# Patient Record
Sex: Male | Born: 1965
Health system: Southern US, Community
[De-identification: ages and names within clinical notes are randomized; demographics above are authoritative.]

## PROBLEM LIST (undated history)

## (undated) DIAGNOSIS — G8929 Other chronic pain: Secondary | ICD-10-CM

## (undated) DIAGNOSIS — N529 Male erectile dysfunction, unspecified: Secondary | ICD-10-CM

## (undated) DIAGNOSIS — I10 Essential (primary) hypertension: Secondary | ICD-10-CM

## (undated) HISTORY — PX: EYE SURGERY: SHX253

## (undated) HISTORY — DX: Other chronic pain: G89.29

## (undated) HISTORY — DX: Male erectile dysfunction, unspecified: N52.9

## (undated) HISTORY — DX: Essential (primary) hypertension: I10

---

## 2018-04-01 ENCOUNTER — Emergency Department (HOSPITAL_COMMUNITY): Payer: Self-pay

## 2018-04-01 ENCOUNTER — Emergency Department (HOSPITAL_COMMUNITY)
Admission: EM | Admit: 2018-04-01 | Discharge: 2018-04-01 | Disposition: A | Discharge status: Home / Self Care | Payer: Self-pay | Attending: Emergency Medicine | Admitting: Emergency Medicine

## 2018-04-01 ENCOUNTER — Encounter (HOSPITAL_COMMUNITY): Payer: Self-pay

## 2018-04-01 DIAGNOSIS — Y939 Activity, unspecified: Secondary | ICD-10-CM | POA: Insufficient documentation

## 2018-04-01 DIAGNOSIS — Y929 Unspecified place or not applicable: Secondary | ICD-10-CM | POA: Insufficient documentation

## 2018-04-01 DIAGNOSIS — R1031 Right lower quadrant pain: Secondary | ICD-10-CM | POA: Insufficient documentation

## 2018-04-01 DIAGNOSIS — X58XXXA Exposure to other specified factors, initial encounter: Secondary | ICD-10-CM | POA: Insufficient documentation

## 2018-04-01 DIAGNOSIS — Z79899 Other long term (current) drug therapy: Secondary | ICD-10-CM | POA: Insufficient documentation

## 2018-04-01 DIAGNOSIS — S39012A Strain of muscle, fascia and tendon of lower back, initial encounter: Secondary | ICD-10-CM | POA: Insufficient documentation

## 2018-04-01 DIAGNOSIS — Y999 Unspecified external cause status: Secondary | ICD-10-CM | POA: Insufficient documentation

## 2018-04-01 LAB — URINALYSIS, ROUTINE W REFLEX MICROSCOPIC
Bacteria, UA: NONE SEEN
Bilirubin Urine: NEGATIVE
GLUCOSE, UA: NEGATIVE mg/dL
Hgb urine dipstick: NEGATIVE
Ketones, ur: NEGATIVE mg/dL
NITRITE: NEGATIVE
PH: 6 (ref 5.0–8.0)
Protein, ur: NEGATIVE mg/dL
SPECIFIC GRAVITY, URINE: 1.006 (ref 1.005–1.030)

## 2018-04-01 LAB — CBC WITH DIFFERENTIAL/PLATELET
ABS IMMATURE GRANULOCYTES: 0.01 10*3/uL (ref 0.00–0.07)
Basophils Absolute: 0 10*3/uL (ref 0.0–0.1)
Basophils Relative: 0 %
Eosinophils Absolute: 0.2 10*3/uL (ref 0.0–0.5)
Eosinophils Relative: 3 %
HCT: 46 % (ref 39.0–52.0)
Hemoglobin: 15.3 g/dL (ref 13.0–17.0)
IMMATURE GRANULOCYTES: 0 %
LYMPHS ABS: 2.1 10*3/uL (ref 0.7–4.0)
Lymphocytes Relative: 30 %
MCH: 30.5 pg (ref 26.0–34.0)
MCHC: 33.3 g/dL (ref 30.0–36.0)
MCV: 91.6 fL (ref 80.0–100.0)
MONOS PCT: 9 %
Monocytes Absolute: 0.7 10*3/uL (ref 0.1–1.0)
NEUTROS ABS: 4.1 10*3/uL (ref 1.7–7.7)
NEUTROS PCT: 58 %
PLATELETS: 248 10*3/uL (ref 150–400)
RBC: 5.02 MIL/uL (ref 4.22–5.81)
RDW: 13.5 % (ref 11.5–15.5)
WBC: 7.1 10*3/uL (ref 4.0–10.5)
nRBC: 0 % (ref 0.0–0.2)

## 2018-04-01 LAB — COMPREHENSIVE METABOLIC PANEL
ALT: 28 U/L (ref 0–44)
ANION GAP: 9 (ref 5–15)
AST: 25 U/L (ref 15–41)
Albumin: 4.2 g/dL (ref 3.5–5.0)
Alkaline Phosphatase: 73 U/L (ref 38–126)
BILIRUBIN TOTAL: 1 mg/dL (ref 0.3–1.2)
BUN: 12 mg/dL (ref 6–20)
CO2: 24 mmol/L (ref 22–32)
Calcium: 9.4 mg/dL (ref 8.9–10.3)
Chloride: 107 mmol/L (ref 98–111)
Creatinine, Ser: 1.05 mg/dL (ref 0.61–1.24)
GFR calc non Af Amer: >60 mL/min (ref >60)
Glucose, Bld: 110 mg/dL — ABNORMAL HIGH (ref 70–99)
POTASSIUM: 3.5 mmol/L (ref 3.5–5.1)
Sodium: 140 mmol/L (ref 135–145)
TOTAL PROTEIN: 6.8 g/dL (ref 6.5–8.1)

## 2018-04-01 LAB — LIPASE, BLOOD: LIPASE: 32 U/L (ref 11–51)

## 2018-04-01 MED ORDER — SODIUM CHLORIDE 0.9 % IV BOLUS
1000.0000 mL | Freq: Once | INTRAVENOUS | Status: AC
  Administered 2018-04-01: 1000 mL via INTRAVENOUS

## 2018-04-01 MED ORDER — MORPHINE SULFATE (PF) 4 MG/ML IV SOLN
4.0000 mg | Freq: Once | INTRAVENOUS | Status: AC
  Administered 2018-04-01: 4 mg via INTRAVENOUS
  Filled 2018-04-01: qty 1

## 2018-04-01 MED ORDER — HYDROCODONE-ACETAMINOPHEN 5-325 MG PO TABS: 1.0000 | ORAL_TABLET | ORAL | 0 refills | Status: AC | PRN

## 2018-04-01 MED ORDER — ONDANSETRON HCL 4 MG/2ML IJ SOLN
4.0000 mg | Freq: Once | INTRAMUSCULAR | Status: AC
  Administered 2018-04-01: 4 mg via INTRAVENOUS
  Filled 2018-04-01: qty 2

## 2018-04-01 NOTE — ED Notes (Signed)
Pt discharged from ED; instructions provided and scripts given; Pt encouraged to return to ED if symptoms worsen and to f/u with PCP; Pt verbalized understanding of all instructions 

## 2018-04-01 NOTE — ED Provider Notes (Signed)
[facility] EMERGENCY DEPARTMENT Provider Note   CSN: [id number] Arrival date & time: 04/01/18  0645     History   Chief Complaint Chief Complaint  Patient presents with  . Back Pain    HPI Steve Schneider is a 52 y.o. male.  Pt presents to the ED today with right sided back pain radiating to his right flank.  The pt said it started at 2100.  The pt denies n/v or f/c.     Pmhx:  htn      Home Medications    Prior to Admission medications   Medication Sig Start Date End Date Taking? Authorizing Provider  amLODipine (NORVASC) 10 MG tablet Take 10 mg by mouth daily.   Yes [provider]  cloNIDine (CATAPRES) 0.1 MG tablet Take 0.1 mg by mouth 2 (two) times daily.   Yes [provider]  LOSARTAN POTASSIUM PO Take by mouth daily.    Yes [provider]  HYDROcodone-acetaminophen (NORCO/VICODIN) 5-325 MG tablet Take 1 tablet by mouth every 4 (four) hours as needed. 04/01/18   Jacalyn Lefevre, MD    Family History History reviewed. No pertinent family history.  Social History Social History   Tobacco Use  . Smoking status: Not on file  Substance Use Topics  . Alcohol use: Not on file  . Drug use: Not on file   + tob + etoh  Allergies   Patient has no known allergies.   Review of Systems Review of Systems  Gastrointestinal: Positive for abdominal pain.  Musculoskeletal: Positive for back pain.  All other systems reviewed and are negative.    Physical Exam Updated Vital Signs BP (!) 152/109 (BP Location: Right Arm)   Pulse 76   Temp 97.9 F (36.6 C) (Oral)   SpO2 97%   Physical Exam  Constitutional: He is oriented to person, place, and time. He appears well-developed and well-nourished.  HENT:  Head: Normocephalic and atraumatic.  Right Ear: External ear normal.  Left Ear: External ear normal.  Nose: Nose normal.  Mouth/Throat: Oropharynx is clear and moist.  Eyes: Pupils are equal, round,  and reactive to light. Conjunctivae and EOM are normal.  Neck: Normal range of motion. Neck supple.  Cardiovascular: Normal rate, regular rhythm, normal heart sounds and intact distal pulses.  Pulmonary/Chest: Effort normal and breath sounds normal.  Abdominal: Soft. Bowel sounds are normal.  Musculoskeletal: Normal range of motion.  Neurological: He is alert and oriented to person, place, and time.  Skin: Skin is warm and dry. Capillary refill takes less than 2 seconds.  Psychiatric: He has a normal mood and affect. His behavior is normal. Judgment and thought content normal.  Nursing note and vitals reviewed.    ED Treatments / Results  Labs (all labs ordered are listed, but only abnormal results are displayed) Labs Reviewed  COMPREHENSIVE METABOLIC PANEL - Abnormal; Notable for the following components:      Result Value   Glucose, Bld 110 (*)    All other components within normal limits  URINALYSIS, ROUTINE W REFLEX MICROSCOPIC - Abnormal; Notable for the following components:   Color, Urine STRAW (*)    Leukocytes, UA SMALL (*)    All other components within normal limits  CBC WITH DIFFERENTIAL/PLATELET  LIPASE, BLOOD    EKG None  Radiology Ct Renal Stone Study  Result Date: 04/01/2018 CLINICAL DATA:  Right flank pain EXAM: CT ABDOMEN AND PELVIS WITHOUT CONTRAST TECHNIQUE: Multidetector CT imaging of the abdomen and pelvis  was performed following the standard protocol without IV contrast. COMPARISON:  12/08/2008 FINDINGS: Lower chest: Lung bases are clear. Hepatobiliary: Unenhanced liver is unremarkable. Gallbladder is unremarkable. No intrahepatic or extrahepatic ductal dilatation. Pancreas: Within normal limits. Spleen: Within normal limits. Adrenals/Urinary Tract: Adrenal glands are within normal limits. Kidneys are within normal limits. No renal, ureteral, or bladder calculi. No hydronephrosis. Bladder is mildly thick-walled although underdistended. Stomach/Bowel: Stomach  is within normal limits. No evidence of bowel obstruction. Normal appendix (series 3/image 50). Vascular/Lymphatic: No evidence of abdominal aortic aneurysm. No suspicious abdominopelvic lymphadenopathy. Reproductive: Prostate is unremarkable. Other: No abdominopelvic ascites. Musculoskeletal: Mild degenerative changes of the lower lumbar spine. IMPRESSION: No renal, ureteral, or bladder calculi. No evidence of bowel obstruction. Normal appendix. No CT findings to account for the patient's right abdominal pain. Electronically Signed   By: Charline BillsSriyesh  Krishnan M.D.   On: 04/01/2018 07:40    Procedures Procedures (including critical care time)  Medications Ordered in ED Medications  morphine 4 MG/ML injection 4 mg (4 mg Intravenous Given 04/01/18 0715)  ondansetron (ZOFRAN) injection 4 mg (4 mg Intravenous Given 04/01/18 0715)  sodium chloride 0.9 % bolus 1,000 mL (0 mLs Intravenous Stopped 04/01/18 0839)     Initial Impression / Assessment and Plan / ED Course  I have reviewed the triage vital signs and the nursing notes.  Pertinent labs & imaging results that were available during my care of the patient were reviewed by me and considered in my medical decision making (see chart for details).     Pt is feeling much better. No etiology of abdominal pain identified.  Pt is stable for d/c.  F/u with pcp.  Final Clinical Impressions(s) / ED Diagnoses   Final diagnoses:  Strain of lumbar region, initial encounter    ED Discharge Orders         Ordered    HYDROcodone-acetaminophen (NORCO/VICODIN) 5-325 MG tablet  Every 4 hours PRN     04/01/18 0959           Jacalyn LefevreHaviland, Eldena Dede, MD 04/01/18 1000

## 2018-04-01 NOTE — ED Triage Notes (Signed)
Pt c/o back pain on R side with radiation to R abd; pt states that pain started last night at 9p.

## 2019-12-24 ENCOUNTER — Ambulatory Visit: Payer: Self-pay | Attending: Internal Medicine

## 2019-12-24 DIAGNOSIS — Z23 Encounter for immunization: Secondary | ICD-10-CM

## 2019-12-24 NOTE — Progress Notes (Signed)
   Covid-19 Vaccination Clinic  Name:  Steve Schneider    MRN: [id number] DOB: July 11, 1965  12/24/2019  [name] was observed post Covid-19 immunization for 15 minutes without incident. He was provided with Vaccine Information Sheet and instruction to access the V-Safe system.   [name] was instructed to call 911 with any severe reactions post vaccine: Marland Kitchen Difficulty breathing  . Swelling of face and throat  . A fast heartbeat  . A bad rash all over body  . Dizziness and weakness   Immunizations Administered    Name Date Dose VIS Date Route   Pfizer COVID-19 Vaccine 12/24/2019  4:24 PM 0.3 mL 07/10/2018 Intramuscular   Manufacturer: [employer], [employer]   Lot: [id number]   NDC: [address]-0

## 2020-01-14 ENCOUNTER — Ambulatory Visit: Payer: Self-pay

## 2020-04-12 IMAGING — CT CT RENAL STONE PROTOCOL
2 of 4 series · 16 of 46 positions shown, 18 images · non-contrast
Comparison: 12/08/2008

CLINICAL DATA: Right flank pain

EXAM:
CT ABDOMEN AND PELVIS WITHOUT CONTRAST
TECHNIQUE: Multidetector CT imaging of the abdomen and pelvis was performed
following the standard protocol without IV contrast.

[Series 3: renal stone 5.0 · axial · 0.73mm/px · z∈[+909,+1289]mm · 13 of 87 slices shown, 15 images]
[im 7/87  soft-tissue]
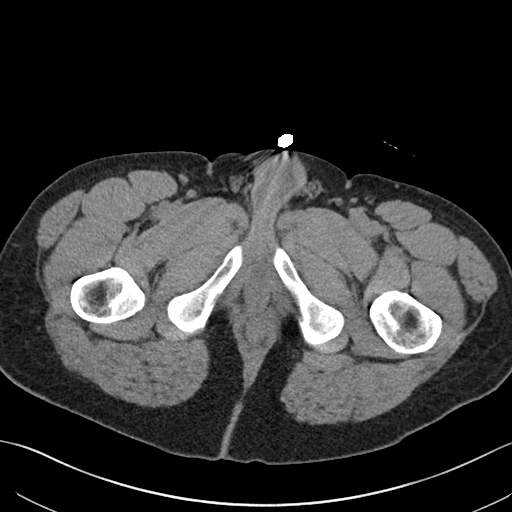
[im 7/87  bone]
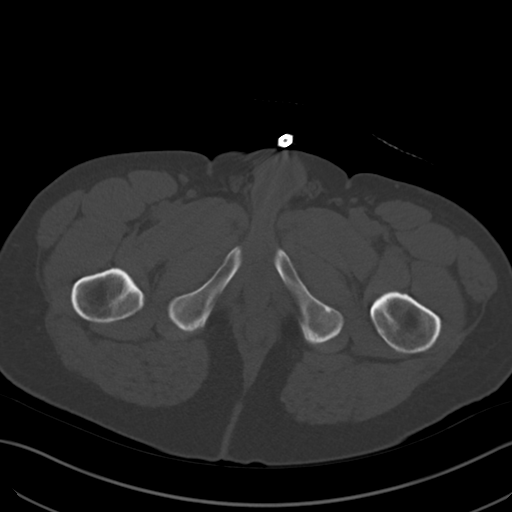
[im 13/87  soft-tissue]
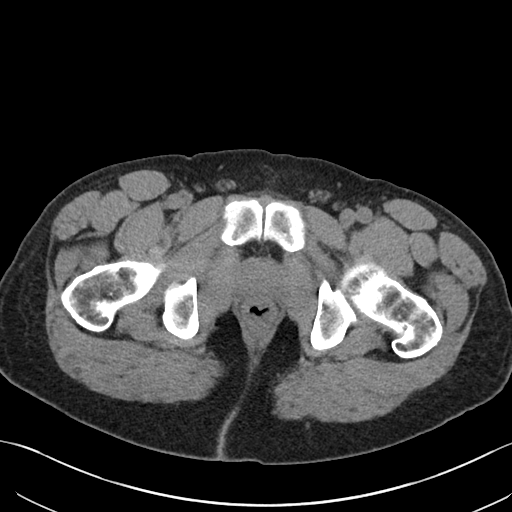
[im 20/87  soft-tissue]
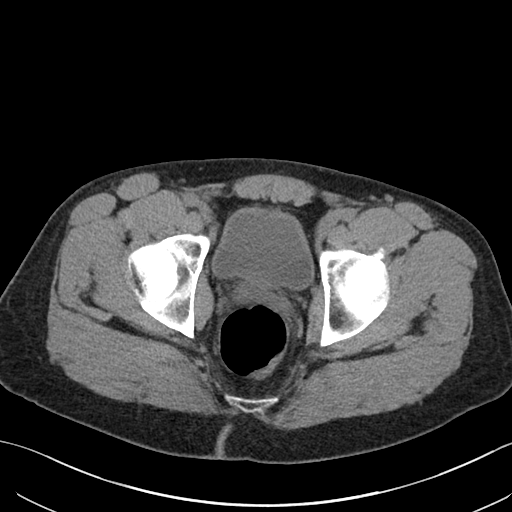
[im 26/87  soft-tissue]
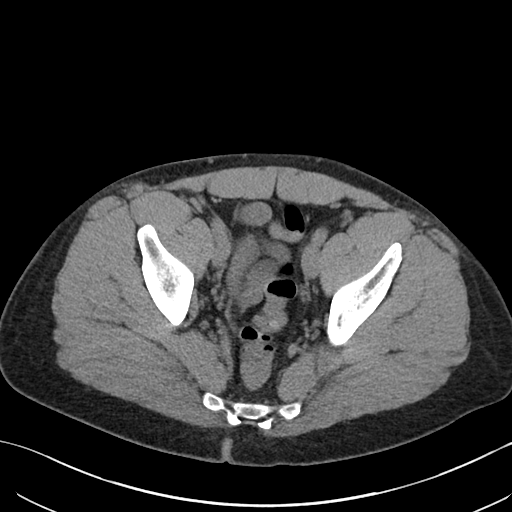
[im 32/87  soft-tissue]
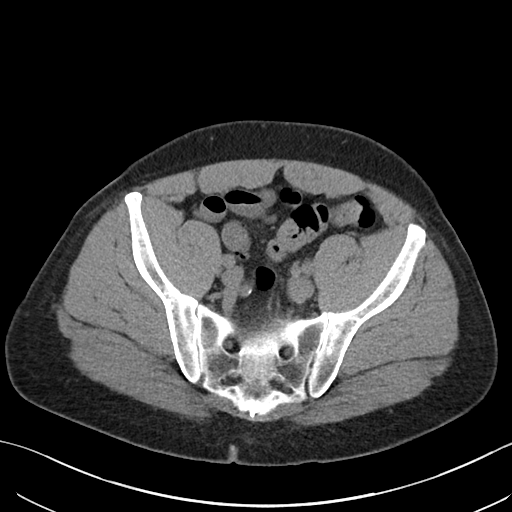
[im 39/87  soft-tissue]
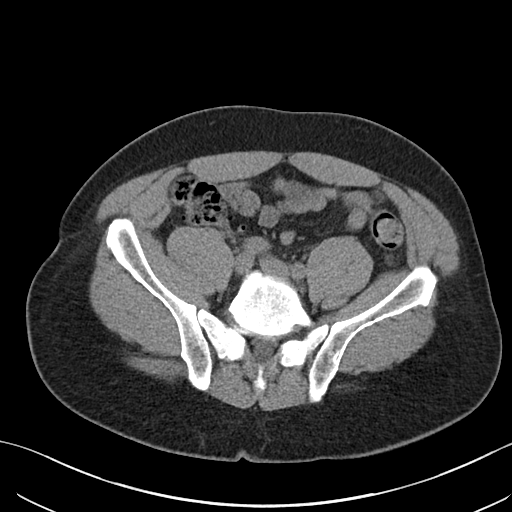
[im 45/87  soft-tissue]
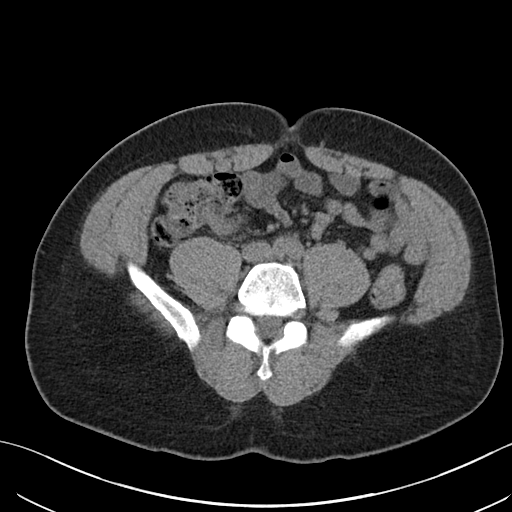
[im 51/87  soft-tissue]
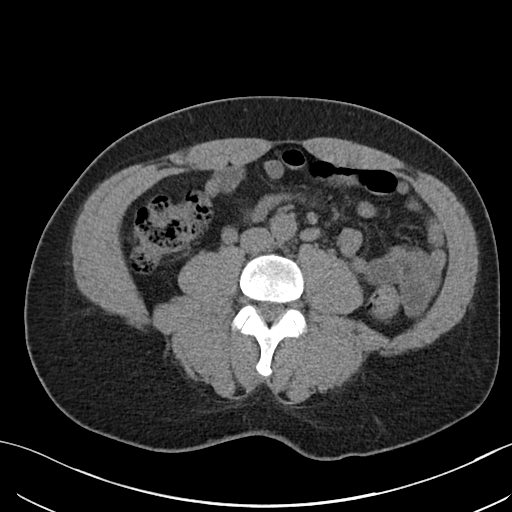
[im 58/87  soft-tissue]
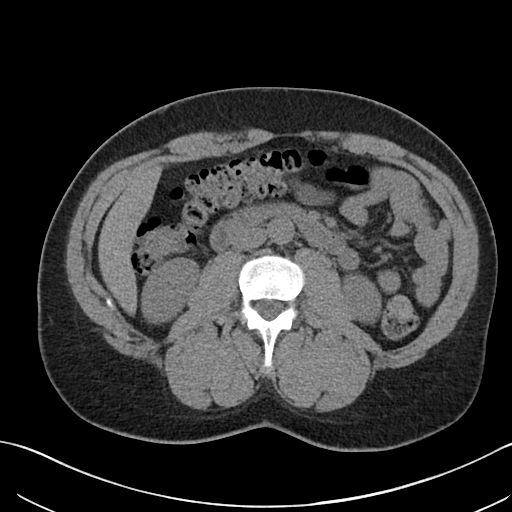
[im 58/87  bone]
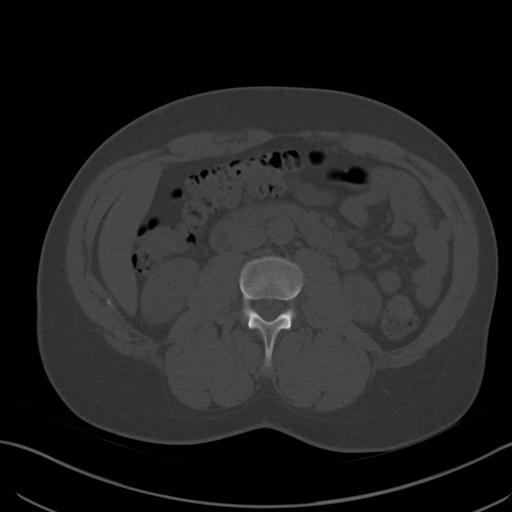
[im 64/87  soft-tissue]
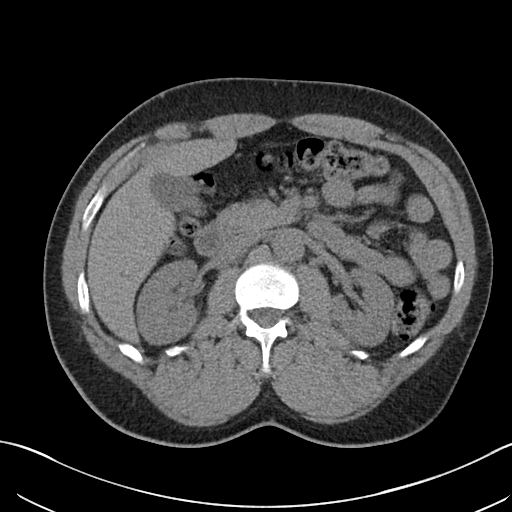
[im 71/87  soft-tissue]
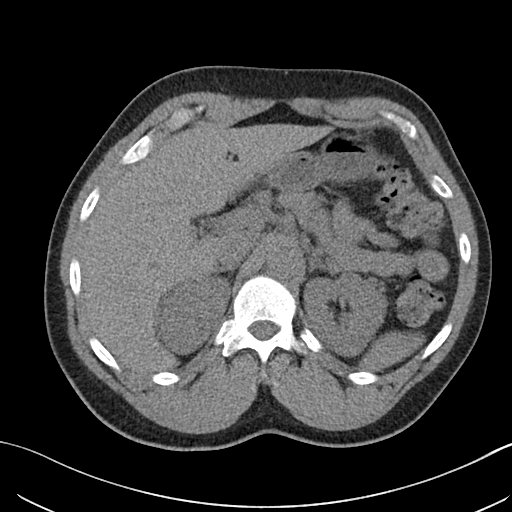
[im 77/87  soft-tissue]
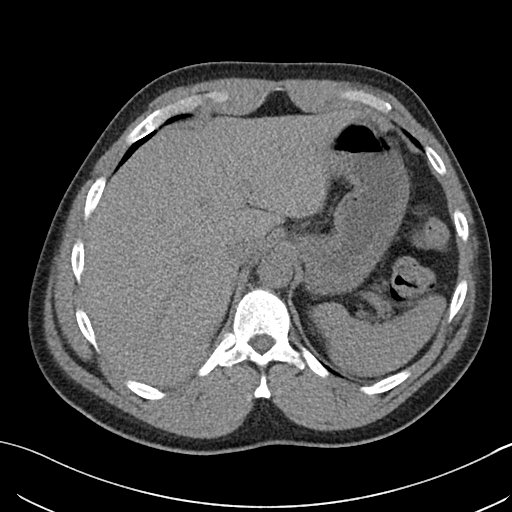
[im 83/87  soft-tissue]
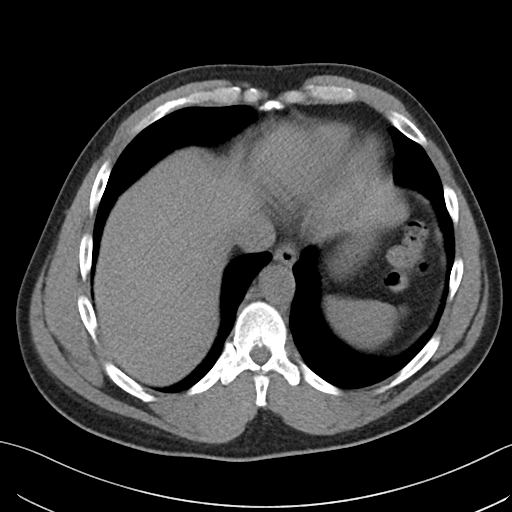

[Series 5: renal stone 3.0 cor · coronal · 0.68mm/px · 3 of 101 slices shown]
[im 34/101  soft-tissue]
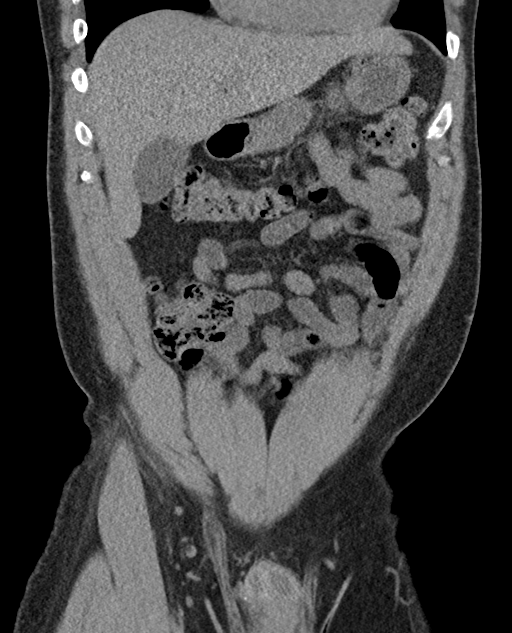
[im 45/101  soft-tissue]
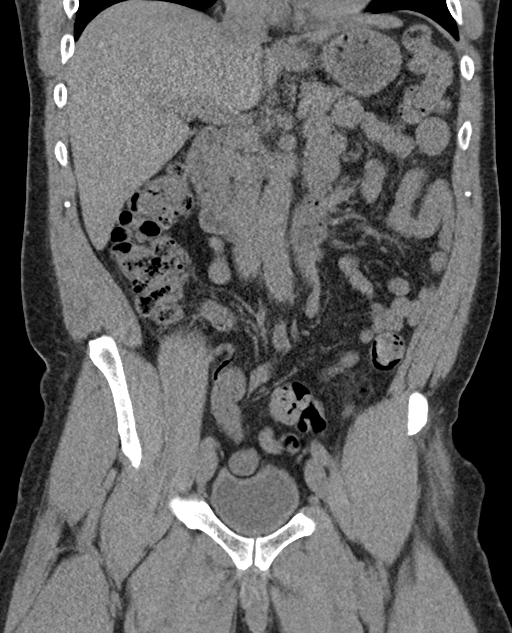
[im 56/101  soft-tissue]
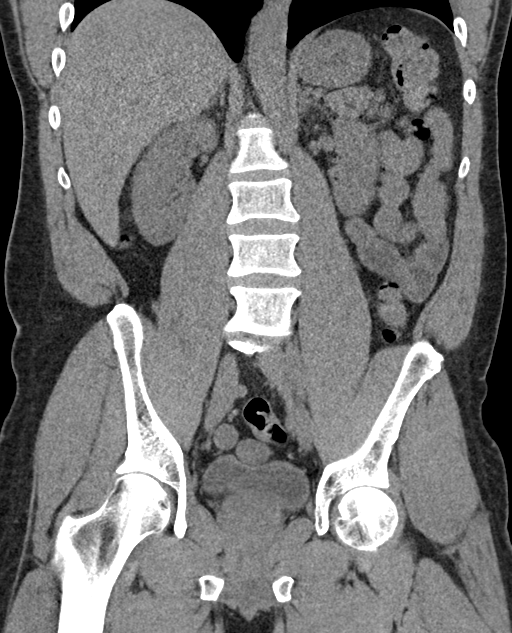

[16 of 46 positions shown; findings below may reference images not displayed]

FINDINGS: Lower chest: Lung bases are clear.

Hepatobiliary: Unenhanced liver is unremarkable.

Gallbladder is unremarkable. No intrahepatic or extrahepatic ductal
dilatation.

Pancreas: Within normal limits.

Spleen: Within normal limits.

Adrenals/Urinary Tract: Adrenal glands are within normal limits.

Kidneys are within normal limits. No renal, ureteral, or bladder
calculi. No hydronephrosis.

Bladder is mildly thick-walled although underdistended.

Stomach/Bowel: Stomach is within normal limits.

No evidence of bowel obstruction.

Normal appendix (series 3/image 50).

Vascular/Lymphatic: No evidence of abdominal aortic aneurysm.

No suspicious abdominopelvic lymphadenopathy.

Reproductive: Prostate is unremarkable.

Other: No abdominopelvic ascites.

Musculoskeletal: Mild degenerative changes of the lower lumbar
spine.
IMPRESSION: No renal, ureteral, or bladder calculi.

No evidence of bowel obstruction. Normal appendix.

No CT findings to account for the patient's right abdominal pain.

## 2021-06-03 ENCOUNTER — Ambulatory Visit: Payer: 59 | Admitting: Family

## 2021-06-03 NOTE — Progress Notes (Signed)
New Patient Office Visit  Subjective:  Patient ID: Steve Schneider, male    DOB: 06-06-1965  Age: 56 y.o. MRN: [id number]  CC:  Chief Complaint  Patient presents with   Establish Care    NP. Est care. Pt c/o chronic back pain w/ osteoarthritis. Request med review & refills    HPI Steve Schneider presents for new patient visit to establish care.  Introduced to Designer, jewellery role and practice setting.  All questions answered.  Discussed provider/patient relationship and expectations.  BACK PAIN  Duration: years Mechanism of injury: unknown - did a lot of lifting for work Location: midline and low back Onset: gradual Severity: 9/10 Quality: aching, sharp Frequency: constant Radiation: none Aggravating factors: laying and prolonged sitting Alleviating factors: ice, heat, and narcotics Status: stable Treatments attempted: rest, ice, heat, APAP, ibuprofen, and physical therapy  Relief with NSAIDs?: no Nighttime pain:  yes Paresthesias / decreased sensation:  no Bowel / bladder incontinence:  no Fevers:  no Dysuria / urinary frequency:  no  HYPERTENSION  Hypertension status: uncontrolled - out of medications for last 3-4 weeks Satisfied with current treatment? yes Duration of hypertension: chronic BP monitoring frequency:  daily BP range: 190/130 BP medication side effects:  no Medication compliance: fair compliance Previous BP meds: amlodipine, labetalol  Aspirin: no Recurrent headaches: no Visual changes: yes - chronic Palpitations: no Dyspnea: no Chest pain: no Lower extremity edema: no Dizzy/lightheaded: no   Past Medical History:  Diagnosis Date   Chronic back pain    ED (erectile dysfunction)    Hypertension     Past Surgical History:  Procedure Laterality Date   EYE SURGERY Right    when a kid    Family History  Problem Relation Age of Onset   Diabetes Mother     Social History   Socioeconomic History   Marital status:  Single    Spouse name: Not on file   Number of children: Not on file   Years of education: Not on file   Highest education level: Not on file  Occupational History   Not on file  Tobacco Use   Smoking status: Every Day    Packs/day: 0.50    Types: Cigarettes   Smokeless tobacco: Never  Vaping Use   Vaping Use: Some days  Substance and Sexual Activity   Alcohol use: Not Currently   Drug use: Never   Sexual activity: Yes  Other Topics Concern   Not on file  Social History Narrative   Not on file   Social Determinants of Health   Financial Resource Strain: Not on file  Food Insecurity: Not on file  Transportation Needs: Not on file  Physical Activity: Not on file  Stress: Not on file  Social Connections: Not on file  Intimate Partner Violence: Not on file    ROS Review of Systems  Constitutional: Negative.   HENT: Negative.    Eyes:  Positive for visual disturbance (chronic, follows with optometry).  Respiratory: Negative.    Cardiovascular: Negative.   Gastrointestinal: Negative.   Endocrine: Negative.   Genitourinary: Negative.   Musculoskeletal:  Positive for back pain.  Skin: Negative.   Neurological: Negative.   Psychiatric/Behavioral: Negative.     Objective:   Today's Vitals: BP (!) 170/120 (BP Location: Right Arm, Cuff Size: Large)    Pulse 85    Temp (!) 96.3 F (35.7 C) (Temporal)    Ht 5\' 9"  (1.753 m)    Wt 212 lb 6.4 oz (  96.3 kg)    SpO2 97%    BMI 31.37 kg/m   Physical Exam Vitals and nursing note reviewed.  Constitutional:      Appearance: Normal appearance.  HENT:     Head: Normocephalic.  Eyes:     Conjunctiva/sclera: Conjunctivae normal.  Cardiovascular:     Rate and Rhythm: Normal rate and regular rhythm.     Pulses: Normal pulses.     Heart sounds: Normal heart sounds.  Pulmonary:     Effort: Pulmonary effort is normal.     Breath sounds: Normal breath sounds.  Abdominal:     Palpations: Abdomen is soft.     Tenderness: There is  no abdominal tenderness.  Musculoskeletal:        General: Tenderness (mid-lower back) present. Normal range of motion.     Cervical back: Normal range of motion.     Right lower leg: No edema.     Left lower leg: No edema.  Skin:    General: Skin is warm.  Neurological:     General: No focal deficit present.     Mental Status: He is alert and oriented to person, place, and time.  Psychiatric:        Mood and Affect: Mood normal.        Behavior: Behavior normal.        Thought Content: Thought content normal.        Judgment: Judgment normal.    Assessment & Plan:   Problem List Items Addressed This Visit       Cardiovascular and Mediastinum   Primary hypertension    Chronic, not controlled. He has been out of his medications for 3-4 weeks. When he was taking his medications, his blood pressure ran 130s-140s/80s. Will re-start amlodipine 10mg  daily and labetalol 100mg  BID. Encourage him to continue checking his blood pressure daily and to write it down. Limit the amount of salt that he is eating in his diet. Last CMP, CBC, and TSH reviewed from 01/2021. Follow up in 4 weeks, will check labs next visit.       Relevant Medications   amLODipine (NORVASC) 10 MG tablet   labetalol (NORMODYNE) 100 MG tablet   sildenafil (VIAGRA) 100 MG tablet     Other   Tobacco use    Currently smokes 1/2 ppd. He had quit in the past cold [place], but after his mother passed away, he started smoking again. He has tried using nicotine patches, however they did not help. Encouraged complete tobacco cessation. He is not interested at this time.       Chronic midline low back pain without sciatica    Chronic, ongoing. He has had a MRI on 3/28/2 which shows disc bulge abutting the L5 nerve route. He has tried ibuprofen, tylenol, gabapentin, lyrica, and cymbalta in the past with no relief. He was taking oxycodone 10mg  daily which helped control his pain. He was referred to Dr. Primus Bravo with pain management  who recommended injections, however he did not want to do this. He was given hydrocodone 10mg /acetaminophen which does not help his pain as much. He would like a referral to a different pain clinic. Referral placed today.       Relevant Orders   Ambulatory referral to Pain Clinic   Obesity (BMI 30-39.9)    BMI 31 today. Discussed healthy eating along with minimizing sodium intake. Goal is to lose 1-2 pounds per day.       Healthcare maintenance  Health maintenance reviewed. Cologuard ordered today to screen for colon cancer. Information given on shingrix. He states he had a tetanus booster about 3 years ago and HIV/hepatitis screening 2 years ago, which was negative.       Erectile dysfunction    Chronic, ongoing. He has been taking viagra 100 mg as needed which has helped his symptoms. Refill sent to the pharmacy today.       Other Visit Diagnoses     Encounter to establish care    -  Primary   Screening for colon cancer       Cologuard ordered today   Relevant Orders   Cologuard       Outpatient Encounter Medications as of 06/04/2021  Medication Sig   [DISCONTINUED] labetalol (NORMODYNE) 100 MG tablet Take by mouth.   [DISCONTINUED] sildenafil (VIAGRA) 100 MG tablet Take by mouth.   amLODipine (NORVASC) 10 MG tablet Take 1 tablet (10 mg total) by mouth daily.   HYDROcodone-acetaminophen (NORCO/VICODIN) 5-325 MG tablet Take 1 tablet by mouth every 4 (four) hours as needed.   labetalol (NORMODYNE) 100 MG tablet Take 1 tablet (100 mg total) by mouth 2 (two) times daily.   sildenafil (VIAGRA) 100 MG tablet Take 1 tablet (100 mg total) by mouth as needed for erectile dysfunction.   [DISCONTINUED] amLODipine (NORVASC) 10 MG tablet Take 10 mg by mouth daily.   [DISCONTINUED] cloNIDine (CATAPRES) 0.1 MG tablet Take 0.1 mg by mouth 2 (two) times daily.   [DISCONTINUED] LOSARTAN POTASSIUM PO Take by mouth daily.    No facility-administered encounter medications on file as of  06/04/2021.    Follow-up: Return in about 4 weeks (around 07/02/2021) for Blood pressure.   Charyl Dancer, NP

## 2021-06-04 ENCOUNTER — Ambulatory Visit (INDEPENDENT_AMBULATORY_CARE_PROVIDER_SITE_OTHER): Payer: 59 | Admitting: Nurse Practitioner

## 2021-06-04 ENCOUNTER — Encounter: Payer: Self-pay | Admitting: Nurse Practitioner

## 2021-06-04 ENCOUNTER — Other Ambulatory Visit: Payer: Self-pay

## 2021-06-04 VITALS — BP 170/120 | HR 85 | Temp 96.3°F | Ht 69.0 in | Wt 212.4 lb

## 2021-06-04 DIAGNOSIS — Z720 Tobacco use: Secondary | ICD-10-CM | POA: Diagnosis not present

## 2021-06-04 DIAGNOSIS — I10 Essential (primary) hypertension: Secondary | ICD-10-CM | POA: Diagnosis not present

## 2021-06-04 DIAGNOSIS — E669 Obesity, unspecified: Secondary | ICD-10-CM

## 2021-06-04 DIAGNOSIS — Z1211 Encounter for screening for malignant neoplasm of colon: Secondary | ICD-10-CM

## 2021-06-04 DIAGNOSIS — Z7689 Persons encountering health services in other specified circumstances: Secondary | ICD-10-CM

## 2021-06-04 DIAGNOSIS — M545 Low back pain, unspecified: Secondary | ICD-10-CM | POA: Diagnosis not present

## 2021-06-04 DIAGNOSIS — G8929 Other chronic pain: Secondary | ICD-10-CM

## 2021-06-04 DIAGNOSIS — N529 Male erectile dysfunction, unspecified: Secondary | ICD-10-CM | POA: Diagnosis not present

## 2021-06-04 DIAGNOSIS — Z0000 Encounter for general adult medical examination without abnormal findings: Secondary | ICD-10-CM

## 2021-06-04 MED ORDER — SILDENAFIL CITRATE 100 MG PO TABS: 100.0000 mg | ORAL_TABLET | ORAL | 2 refills | Status: DC | PRN

## 2021-06-04 MED ORDER — LABETALOL HCL 100 MG PO TABS: 100.0000 mg | ORAL_TABLET | Freq: Two times a day (BID) | ORAL | 1 refills | Status: AC

## 2021-06-04 MED ORDER — AMLODIPINE BESYLATE 10 MG PO TABS: 10.0000 mg | ORAL_TABLET | Freq: Every day | ORAL | 1 refills | Status: AC

## 2021-06-04 NOTE — Assessment & Plan Note (Signed)
Chronic, not controlled. He has been out of his medications for 3-4 weeks. When he was taking his medications, his blood pressure ran 130s-140s/80s. Will re-start amlodipine 10mg  daily and labetalol 100mg  BID. Encourage him to continue checking his blood pressure daily and to write it down. Limit the amount of salt that he is eating in his diet. Last CMP, CBC, and TSH reviewed from 01/2021. Follow up in 4 weeks, will check labs next visit.

## 2021-06-04 NOTE — Assessment & Plan Note (Signed)
Currently smokes 1/2 ppd. He had quit in the past cold [place], but after his mother passed away, he started smoking again. He has tried using nicotine patches, however they did not help. Encouraged complete tobacco cessation. He is not interested at this time.

## 2021-06-04 NOTE — Assessment & Plan Note (Signed)
BMI 31 today. Discussed healthy eating along with minimizing sodium intake. Goal is to lose 1-2 pounds per day.

## 2021-06-04 NOTE — Assessment & Plan Note (Signed)
Chronic, ongoing. He has been taking viagra 100 mg as needed which has helped his symptoms. Refill sent to the pharmacy today.

## 2021-06-04 NOTE — Assessment & Plan Note (Signed)
Health maintenance reviewed. Cologuard ordered today to screen for colon cancer. Information given on shingrix. He states he had a tetanus booster about 3 years ago and HIV/hepatitis screening 2 years ago, which was negative.

## 2021-06-04 NOTE — Assessment & Plan Note (Signed)
Chronic, ongoing. He has had a MRI on 3/28/2 which shows disc bulge abutting the L5 nerve route. He has tried ibuprofen, tylenol, gabapentin, lyrica, and cymbalta in the past with no relief. He was taking oxycodone 10mg  daily which helped control his pain. He was referred to Dr. with pain management who recommended injections, however he did not want to do this. He was given hydrocodone 10mg /acetaminophen which does not help his pain as much. He would like a referral to a different pain clinic. Referral placed today.

## 2021-06-04 NOTE — Patient Instructions (Addendum)
It was great to see you!  I have refilled your blood pressure medications, start taking them as you were before. Continue to check your blood pressure daily. Limit the amount of sodium (salt) you eat in your diet  Let's follow-up in 4 weeks, sooner if you have concerns.  If a referral was placed today, you will be contacted for an appointment. Please note that routine referrals can sometimes take up to 3-4 weeks to process. Please call our office if you haven't heard anything after this time frame.  Take care,  Rodman Pickle, NP

## 2021-06-09 ENCOUNTER — Telehealth: Payer: Self-pay | Admitting: Nurse Practitioner

## 2021-06-09 DIAGNOSIS — N529 Male erectile dysfunction, unspecified: Secondary | ICD-10-CM

## 2021-06-09 MED ORDER — SILDENAFIL CITRATE 100 MG PO TABS: 100.0000 mg | ORAL_TABLET | ORAL | 2 refills | Status: AC | PRN

## 2021-06-09 NOTE — Telephone Encounter (Signed)
Pt called to ask if his Rx for Viagra can be sent to the [facility] in [place] (off of [address]). His insurance only covers it there.

## 2021-06-09 NOTE — Telephone Encounter (Signed)
Called pt 3x no answer, lvm. Rx request for sildenafil to be sent to preferred pharmacy has been approved. Sw, cma

## 2021-06-16 ENCOUNTER — Telehealth: Payer: Self-pay | Admitting: Nurse Practitioner

## 2021-06-16 NOTE — Telephone Encounter (Signed)
Pt moved to  [facility] [address] [place], [place] [address] Phone: [phone]  7 Days a Week 8am - 6pm

## 2021-06-21 DIAGNOSIS — M25572 Pain in left ankle and joints of left foot: Secondary | ICD-10-CM | POA: Diagnosis not present

## 2021-06-21 DIAGNOSIS — M25571 Pain in right ankle and joints of right foot: Secondary | ICD-10-CM | POA: Diagnosis not present

## 2021-06-21 DIAGNOSIS — M109 Gout, unspecified: Secondary | ICD-10-CM | POA: Diagnosis not present

## 2021-06-21 DIAGNOSIS — R5383 Other fatigue: Secondary | ICD-10-CM | POA: Diagnosis not present

## 2021-06-21 DIAGNOSIS — I10 Essential (primary) hypertension: Secondary | ICD-10-CM | POA: Diagnosis not present

## 2021-07-01 NOTE — Progress Notes (Unsigned)
Established Patient Office Visit  Subjective:  Patient ID: Steve Schneider, male    DOB: 1965-08-18  Age: 56 y.o. MRN: [id number]  CC: No chief complaint on file.   HPI Steve Schneider presents for follow up on blood pressure.  Past Medical History:  Diagnosis Date   Chronic back pain    ED (erectile dysfunction)    Hypertension     Past Surgical History:  Procedure Laterality Date   EYE SURGERY Right    when a kid    Family History  Problem Relation Age of Onset   Diabetes Mother     Social History   Socioeconomic History   Marital status: Single    Spouse name: Not on file   Number of children: Not on file   Years of education: Not on file   Highest education level: Not on file  Occupational History   Not on file  Tobacco Use   Smoking status: Every Day    Packs/day: 0.50    Types: Cigarettes   Smokeless tobacco: Never  Vaping Use   Vaping Use: Some days  Substance and Sexual Activity   Alcohol use: Not Currently   Drug use: Never   Sexual activity: Yes  Other Topics Concern   Not on file  Social History Narrative   Not on file   Social Determinants of Health   Financial Resource Strain: Not on file  Food Insecurity: Not on file  Transportation Needs: Not on file  Physical Activity: Not on file  Stress: Not on file  Social Connections: Not on file  Intimate Partner Violence: Not on file    Outpatient Medications Prior to Visit  Medication Sig Dispense Refill   amLODipine (NORVASC) 10 MG tablet Take 1 tablet (10 mg total) by mouth daily. 90 tablet 1   HYDROcodone-acetaminophen (NORCO/VICODIN) 5-325 MG tablet Take 1 tablet by mouth every 4 (four) hours as needed. 10 tablet 0   labetalol (NORMODYNE) 100 MG tablet Take 1 tablet (100 mg total) by mouth 2 (two) times daily. 180 tablet 1   sildenafil (VIAGRA) 100 MG tablet Take 1 tablet (100 mg total) by mouth as needed for erectile dysfunction. 10 tablet 2   No facility-administered  medications prior to visit.    No Known Allergies  ROS Review of Systems    Objective:    Physical Exam  There were no vitals taken for this visit. Wt Readings from Last 3 Encounters:  06/04/21 212 lb 6.4 oz (96.3 kg)     Health Maintenance Due  Topic Date Due   HIV Screening  Never done   Hepatitis C Screening  Never done   TETANUS/TDAP  Never done   COLONOSCOPY (Pts 45-64yrs Insurance coverage will need to be confirmed)  Never done   Zoster Vaccines- Shingrix (1 of 2) Never done   COVID-19 Vaccine (2 - Pfizer series) 01/14/2020    There are no preventive care reminders to display for this patient.  No results found for: TSH Lab Results  Component Value Date   WBC 7.1 04/01/2018   HGB 15.3 04/01/2018   HCT 46.0 04/01/2018   MCV 91.6 04/01/2018   PLT 248 04/01/2018   Lab Results  Component Value Date   NA 140 04/01/2018   K 3.5 04/01/2018   CO2 24 04/01/2018   GLUCOSE 110 (H) 04/01/2018   BUN 12 04/01/2018   CREATININE 1.05 04/01/2018   BILITOT 1.0 04/01/2018   ALKPHOS 73 04/01/2018   AST 25 04/01/2018  ALT 28 04/01/2018   PROT 6.8 04/01/2018   ALBUMIN 4.2 04/01/2018   CALCIUM 9.4 04/01/2018   ANIONGAP 9 04/01/2018   No results found for: CHOL No results found for: HDL No results found for: LDLCALC No results found for: TRIG No results found for: CHOLHDL No results found for: IRWE3X    Assessment & Plan:   Problem List Items Addressed This Visit   None   No orders of the defined types were placed in this encounter.   Follow-up: No follow-ups on file.    Gerre Scull, NP

## 2021-07-02 ENCOUNTER — Ambulatory Visit: Payer: 59 | Admitting: Nurse Practitioner

## 2021-08-23 ENCOUNTER — Telehealth: Payer: Self-pay | Admitting: Nurse Practitioner

## 2021-08-23 DIAGNOSIS — N529 Male erectile dysfunction, unspecified: Secondary | ICD-10-CM

## 2021-08-24 NOTE — Telephone Encounter (Signed)
Pt requesting refill on BP medicine and viagra.  ?

## 2021-08-24 NOTE — Telephone Encounter (Signed)
Called and ldvm pt needs to cb and schedule an appt before sidenafil can be refilled. Sw,cma ?

## 2021-08-30 ENCOUNTER — Other Ambulatory Visit: Payer: Self-pay | Admitting: Nurse Practitioner

## 2021-08-30 DIAGNOSIS — N529 Male erectile dysfunction, unspecified: Secondary | ICD-10-CM

## 2021-08-30 NOTE — Progress Notes (Deleted)
Established Patient Office Visit  Subjective:  Patient ID: Steve Schneider, male    DOB: 1966/02/12  Age: 56 y.o. MRN: [id number]  CC: No chief complaint on file.   HPI Steve Schneider presents for follow-up on hypertension  Past Medical History:  Diagnosis Date   Chronic back pain    ED (erectile dysfunction)    Hypertension     Past Surgical History:  Procedure Laterality Date   EYE SURGERY Right    when a kid    Family History  Problem Relation Age of Onset   Diabetes Mother     Social History   Socioeconomic History   Marital status: Single    Spouse name: Not on file   Number of children: Not on file   Years of education: Not on file   Highest education level: Not on file  Occupational History   Not on file  Tobacco Use   Smoking status: Every Day    Packs/day: 0.50    Types: Cigarettes   Smokeless tobacco: Never  Vaping Use   Vaping Use: Some days  Substance and Sexual Activity   Alcohol use: Not Currently   Drug use: Never   Sexual activity: Yes  Other Topics Concern   Not on file  Social History Narrative   Not on file   Social Determinants of Health   Financial Resource Strain: Not on file  Food Insecurity: Not on file  Transportation Needs: Not on file  Physical Activity: Not on file  Stress: Not on file  Social Connections: Not on file  Intimate Partner Violence: Not on file    Outpatient Medications Prior to Visit  Medication Sig Dispense Refill   amLODipine (NORVASC) 10 MG tablet Take 1 tablet (10 mg total) by mouth daily. 90 tablet 1   HYDROcodone-acetaminophen (NORCO/VICODIN) 5-325 MG tablet Take 1 tablet by mouth every 4 (four) hours as needed. 10 tablet 0   labetalol (NORMODYNE) 100 MG tablet Take 1 tablet (100 mg total) by mouth 2 (two) times daily. 180 tablet 1   sildenafil (VIAGRA) 100 MG tablet Take 1 tablet (100 mg total) by mouth as needed for erectile dysfunction. 10 tablet 2   No facility-administered  medications prior to visit.    No Known Allergies  ROS Review of Systems    Objective:    Physical Exam  There were no vitals taken for this visit. Wt Readings from Last 3 Encounters:  06/04/21 212 lb 6.4 oz (96.3 kg)     Health Maintenance Due  Topic Date Due   HIV Screening  Never done   Hepatitis C Screening  Never done   TETANUS/TDAP  Never done   COLONOSCOPY (Pts 45-31yrs Insurance coverage will need to be confirmed)  Never done   Zoster Vaccines- Shingrix (1 of 2) Never done   COVID-19 Vaccine (2 - Pfizer series) 01/14/2020    There are no preventive care reminders to display for this patient.  No results found for: TSH Lab Results  Component Value Date   WBC 7.1 04/01/2018   HGB 15.3 04/01/2018   HCT 46.0 04/01/2018   MCV 91.6 04/01/2018   PLT 248 04/01/2018   Lab Results  Component Value Date   NA 140 04/01/2018   K 3.5 04/01/2018   CO2 24 04/01/2018   GLUCOSE 110 (H) 04/01/2018   BUN 12 04/01/2018   CREATININE 1.05 04/01/2018   BILITOT 1.0 04/01/2018   ALKPHOS 73 04/01/2018   AST 25 04/01/2018  ALT 28 04/01/2018   PROT 6.8 04/01/2018   ALBUMIN 4.2 04/01/2018   CALCIUM 9.4 04/01/2018   ANIONGAP 9 04/01/2018   No results found for: CHOL No results found for: HDL No results found for: LDLCALC No results found for: TRIG No results found for: CHOLHDL No results found for: ZLDJ5T    Assessment & Plan:   Problem List Items Addressed This Visit   None   No orders of the defined types were placed in this encounter.   Follow-up: No follow-ups on file.    Gerre Scull, NP

## 2021-08-31 ENCOUNTER — Ambulatory Visit: Payer: 59 | Admitting: Nurse Practitioner

## 2021-09-01 ENCOUNTER — Ambulatory Visit: Payer: 59 | Admitting: Nurse Practitioner

## 2021-09-03 ENCOUNTER — Ambulatory Visit: Payer: 59 | Admitting: Nurse Practitioner

## 2021-09-03 ENCOUNTER — Telehealth: Payer: Self-pay | Admitting: Nurse Practitioner

## 2021-09-03 NOTE — Telephone Encounter (Signed)
Agree with this.  

## 2021-09-03 NOTE — Progress Notes (Deleted)
   Established Patient Office Visit  Subjective   Patient ID: Steve Schneider, male    DOB: 14-Dec-1965  Age: 56 y.o. MRN: [id number]  No chief complaint on file.   HPI  Steve Schneider is here today to follow-up on hypertension. Last visit his amlodipine and labetalol were re-started.   Past Medical History:  Diagnosis Date   Chronic back pain    ED (erectile dysfunction)    Hypertension    Past Surgical History:  Procedure Laterality Date   EYE SURGERY Right    when a kid   Social History   Tobacco Use   Smoking status: Every Day    Packs/day: 0.50    Types: Cigarettes   Smokeless tobacco: Never  Vaping Use   Vaping Use: Some days  Substance Use Topics   Alcohol use: Not Currently   Drug use: Never   Family History  Problem Relation Age of Onset   Diabetes Mother       ROS    Objective:     There were no vitals taken for this visit. BP Readings from Last 3 Encounters:  06/04/21 (!) 170/120  04/01/18 (!) 139/99      Physical Exam   No results found for any visits on 09/03/21.  Last CBC Lab Results  Component Value Date   WBC 7.1 04/01/2018   HGB 15.3 04/01/2018   HCT 46.0 04/01/2018   MCV 91.6 04/01/2018   MCH 30.5 04/01/2018   RDW 13.5 04/01/2018   PLT 248 04/01/2018   Last metabolic panel Lab Results  Component Value Date   GLUCOSE 110 (H) 04/01/2018   NA 140 04/01/2018   K 3.5 04/01/2018   CL 107 04/01/2018   CO2 24 04/01/2018   BUN 12 04/01/2018   CREATININE 1.05 04/01/2018   GFRNONAA >60 04/01/2018   CALCIUM 9.4 04/01/2018   PROT 6.8 04/01/2018   ALBUMIN 4.2 04/01/2018   BILITOT 1.0 04/01/2018   ALKPHOS 73 04/01/2018   AST 25 04/01/2018   ALT 28 04/01/2018   ANIONGAP 9 04/01/2018      The 10-year ASCVD risk score (Arnett DK, et al., 2019) is: 14.2%    Assessment & Plan:   Problem List Items Addressed This Visit   None   No follow-ups on file.    Gerre Scull, NP

## 2021-09-03 NOTE — Telephone Encounter (Signed)
Deann called to cancel/reschedule today's appt. Appt was at 1p.  ? ?spoke to [facility] who brings pt, he is in [facility] and she is a working [employer] fundraiser and not able to leave shift, 4th same day cancellation, advised of no show/cancellation policy and next time would be dismissal ?

## 2021-09-07 NOTE — Progress Notes (Deleted)
   Established Patient Office Visit  Subjective   Patient ID: Steve Schneider, male    DOB: 1966/04/25  Age: 56 y.o. MRN: [id number]  No chief complaint on file.   HPI  Alisa is here to follow-up on hypertension. He was re-started on amlodipine 10mg  and labetalol   {History (Optional):23778}  ROS    Objective:     There were no vitals taken for this visit. {Vitals History (Optional):23777}  Physical Exam   No results found for any visits on 09/08/21.  {Labs (Optional):23779}  The 10-year ASCVD risk score (Arnett DK, et al., 2019) is: 14.2%    Assessment & Plan:   Problem List Items Addressed This Visit   None   No follow-ups on file.    Charyl Dancer, NP

## 2021-09-08 ENCOUNTER — Encounter: Payer: Self-pay | Admitting: Nurse Practitioner

## 2021-09-08 ENCOUNTER — Telehealth: Payer: Self-pay | Admitting: Nurse Practitioner

## 2021-09-08 ENCOUNTER — Ambulatory Visit: Payer: 59 | Admitting: Nurse Practitioner

## 2021-09-08 NOTE — Telephone Encounter (Signed)
This is in the app's note section----pt does not have a ride-gg-bp and med check ? 4/21 KO if late cancellation/no show/late arrival pt will be dismissed due to 4 prior same day cancellations ? ? ?I did not send a letter, I thought you may wanna handle this. ?

## 2021-09-23 NOTE — Telephone Encounter (Signed)
5th no show/same day cancel - dismissal generated - did not charge fee ?
